# Patient Record
Sex: Female | Born: 1950 | Hispanic: Yes | Smoking: Current some day smoker
Health system: Southern US, Community
[De-identification: ages and names within clinical notes are randomized; demographics above are authoritative.]

## PROBLEM LIST (undated history)

## (undated) HISTORY — PX: ABDOMINAL SURGERY: SHX537

## (undated) HISTORY — PX: APPENDECTOMY: SHX54

## (undated) HISTORY — PX: SHOULDER SURGERY: SHX246

## (undated) HISTORY — PX: ABDOMINAL HYSTERECTOMY: SHX81

## (undated) HISTORY — PX: FOOT SURGERY: SHX648

---

## 2018-02-05 ENCOUNTER — Emergency Department (HOSPITAL_BASED_OUTPATIENT_CLINIC_OR_DEPARTMENT_OTHER): Payer: Medicare HMO

## 2018-02-05 ENCOUNTER — Emergency Department (HOSPITAL_BASED_OUTPATIENT_CLINIC_OR_DEPARTMENT_OTHER)
Admission: EM | Admit: 2018-02-05 | Discharge: 2018-02-05 | Disposition: A | Discharge status: Home / Self Care | Payer: Medicare HMO | Attending: Emergency Medicine | Admitting: Emergency Medicine

## 2018-02-05 ENCOUNTER — Other Ambulatory Visit: Payer: Self-pay

## 2018-02-05 ENCOUNTER — Encounter (HOSPITAL_BASED_OUTPATIENT_CLINIC_OR_DEPARTMENT_OTHER): Payer: Self-pay

## 2018-02-05 DIAGNOSIS — M67912 Unspecified disorder of synovium and tendon, left shoulder: Secondary | ICD-10-CM | POA: Diagnosis not present

## 2018-02-05 DIAGNOSIS — Y939 Activity, unspecified: Secondary | ICD-10-CM | POA: Insufficient documentation

## 2018-02-05 DIAGNOSIS — Y929 Unspecified place or not applicable: Secondary | ICD-10-CM | POA: Insufficient documentation

## 2018-02-05 DIAGNOSIS — F17200 Nicotine dependence, unspecified, uncomplicated: Secondary | ICD-10-CM | POA: Diagnosis not present

## 2018-02-05 DIAGNOSIS — Y999 Unspecified external cause status: Secondary | ICD-10-CM | POA: Diagnosis not present

## 2018-02-05 DIAGNOSIS — Z79899 Other long term (current) drug therapy: Secondary | ICD-10-CM | POA: Insufficient documentation

## 2018-02-05 DIAGNOSIS — W19XXXA Unspecified fall, initial encounter: Secondary | ICD-10-CM | POA: Diagnosis not present

## 2018-02-05 DIAGNOSIS — S4992XA Unspecified injury of left shoulder and upper arm, initial encounter: Secondary | ICD-10-CM | POA: Diagnosis present

## 2018-02-05 NOTE — ED Notes (Signed)
ED Provider at bedside. 

## 2018-02-05 NOTE — ED Triage Notes (Signed)
Pt states she fell 3 months ago-pain to left shoulder-NAD-steady gait

## 2018-02-05 NOTE — Discharge Instructions (Addendum)
You have what appears to be weakness to the rotator cuff.  This may be related to injury and possibly a tear.  I have attached information with sports medicine.  Please call and schedule appointment.  Follow-up with your primary care doctor.  You can apply cold compress to your shoulder and take ibuprofen 800 mg every 8 hours as needed.  You will likely need physical therapy.

## 2018-02-05 NOTE — ED Provider Notes (Signed)
[facility] EMERGENCY DEPARTMENT Provider Note   CSN: [id number] Arrival date & time: 02/05/18  1157     History   Chief Complaint Chief Complaint  Patient presents with  . Shoulder Injury    HPI Cyndie MullHelen Vigilante is a 67 y.o. female.  Patient is a 67 year old female presenting with subacute left shoulder pain after a mechanical fall 3 weeks ago.  PMH significant for prior bilateral rotator cuff repairs with her last being 10 years ago on [place].  Patient reports losing balance and falling approximately 3-4 weeks ago resulting in her falling on her anterior left shoulder.  She did not have any swelling immediately following the event but did have pain particularly on the inferior surface of her scapula.  Patient was hoping pain would resolve with use of Tylenol and ibuprofen while at home over the last few weeks but is here today because pain is not improving.  Patient does report some associated weakness with her left upper extremity since the fall otherwise sensation remains intact.  Patient reports pain is worse with reaching overhead.  She is an occasional smoker denies alcohol or illicit drug use.     History reviewed. No pertinent past medical history.  There are no active problems to display for this patient.   Past Surgical History:  Procedure Laterality Date  . ABDOMINAL HYSTERECTOMY    . ABDOMINAL SURGERY    . APPENDECTOMY    . FOOT SURGERY    . SHOULDER SURGERY       OB History   None      Home Medications    Prior to Admission medications   Medication Sig Start Date End Date Taking? Authorizing Provider  lisinopril (PRINIVIL,ZESTRIL) 10 MG tablet Take 10 mg by mouth daily.   Yes [provider]  QUEtiapine (SEROQUEL) 100 MG tablet Take 100 mg by mouth at bedtime.   Yes [provider]  rosuvastatin (CRESTOR) 10 MG tablet Take 10 mg by mouth daily.   Yes [provider]    Family History No  family history on file.  Social History Social History   Tobacco Use  . Smoking status: Current Some Day Smoker  . Smokeless tobacco: Never Used  Substance Use Topics  . Alcohol use: Yes    Comment: occ  . Drug use: Never     Allergies   Patient has no known allergies.   Review of Systems Review of Systems  Constitutional: Negative for chills and fever.  Eyes: Negative for pain and visual disturbance.  Respiratory: Negative for cough and shortness of breath.   Cardiovascular: Negative for chest pain and palpitations.  Gastrointestinal: Negative for abdominal pain and vomiting.  Genitourinary: Negative for dysuria and frequency.  Musculoskeletal: Positive for arthralgias, gait problem and joint swelling. Negative for back pain, myalgias, neck pain and neck stiffness.  Skin: Negative for color change and rash.  Neurological: Positive for weakness. Negative for dizziness, seizures, syncope, light-headedness, numbness and headaches.  All other systems reviewed and are negative.    Physical Exam Updated Vital Signs BP (!) 147/57 (BP Location: Right Arm)   Pulse 92   Temp 98.1 F (36.7 C) (Oral)   Resp 18   Ht 5\' 3"  (1.6 m)   Wt 70.8 kg   SpO2 97%   BMI 27.63 kg/m   Physical Exam  Constitutional: She appears well-developed and well-nourished. No distress.  HENT:  Head: Normocephalic and atraumatic.  Eyes: Pupils are equal, round,  and reactive to light. EOM are normal.  Neck: Normal range of motion. Neck supple.  Cardiovascular: Normal rate and regular rhythm.  No murmur heard. Pulmonary/Chest: Effort normal and breath sounds normal. No respiratory distress.  Abdominal: Soft. There is no tenderness.  Musculoskeletal:  Left and right shoulder symmetrical.  No step-off on left clavicle or signs of deformity.  No overlying ecchymoses and no induration at joint space.  Patient able to abduct to approximately 90 degrees passively and can further abduct to 140 degrees  passively.  Internal rotation preserved.  External rotation reduced on left shoulder limited due to tenderness.  AC joint without tenderness.  Inferior left scapula moderately tender on palpation.  Neurological: She is alert.  Skin: Skin is warm and dry.  Psychiatric: She has a normal mood and affect.  Nursing note and vitals reviewed.    ED Treatments / Results  Labs (all labs ordered are listed, but only abnormal results are displayed) Labs Reviewed - No data to display  EKG None  Radiology Dg Shoulder Left  Result Date: 02/05/2018 CLINICAL DATA:  Fall 3 months ago with top of shoulder pain. Initial encounter. EXAM: LEFT SHOULDER - 2+ VIEW COMPARISON:  None. FINDINGS: Prior distal clavicle resection. No fracture or dislocation. Negative visible left ribs. IMPRESSION: 1. No acute finding. 2. Distal clavicle resection. Electronically Signed   By: Marnee Spring M.D.   On: 02/05/2018 12:32    Procedures Procedures (including critical care time)  Medications Ordered in ED Medications - No data to display   Initial Impression / Assessment and Plan / ED Course  I have reviewed the triage vital signs and the nursing notes.  Pertinent labs & imaging results that were available during my care of the patient were reviewed by me and considered in my medical decision making (see chart for details).  Patient is a 67 year old female presenting with subacute left shoulder pain following a mechanical fall 3 weeks ago.  She does have minimal weakness with left arm and particularly reduced external rotation limited due to tenderness.  There is no obvious signs of deformity.  Patient appears comfortable on exam.  Left shoulder plain films revealing prior distal clavicle resection without signs of fracture or dislocation.  Clinical exam consistent with rotator cuff tendinopathy primarily involving external rotators suspicious for tear of infraspinatus.  Will provide patient with information with  sports medicine advised to follow-up with PCP for referral.  Reviewed return precautions.  Discussed conservative therapy.  Final Clinical Impressions(s) / ED Diagnoses   Final diagnoses:  Tendinopathy of left rotator cuff    ED Discharge Orders    None       Wendee Beavers, DO 02/05/18 1256    Maia Plan, MD 02/06/18 [phone]

## 2019-10-29 IMAGING — CR DG SHOULDER 2+V*L*
3 series · 3 of 3 positions shown · non-contrast
Comparison: None.

CLINICAL DATA: Fall 3 months ago with top of shoulder pain. Initial
encounter.

EXAM:
LEFT SHOULDER - 2+ VIEW

[w shoulder grashey left]
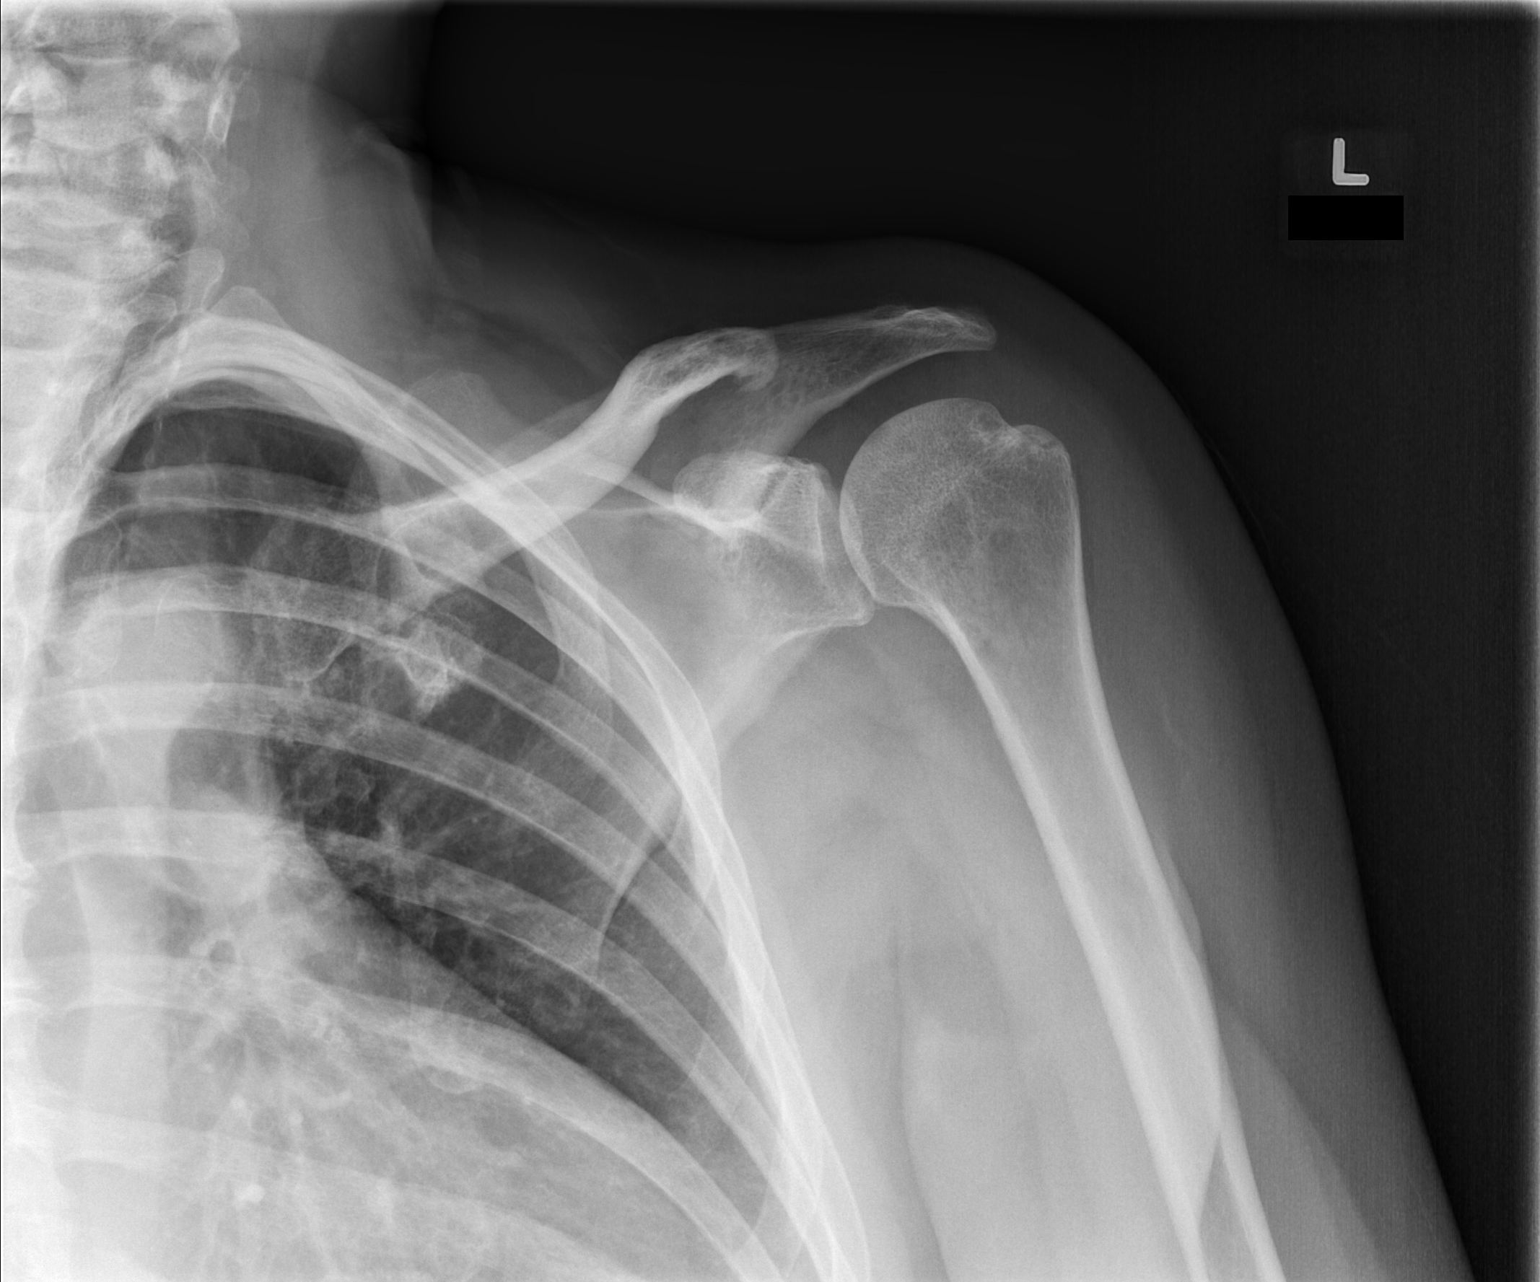

[w shoulder y view left]
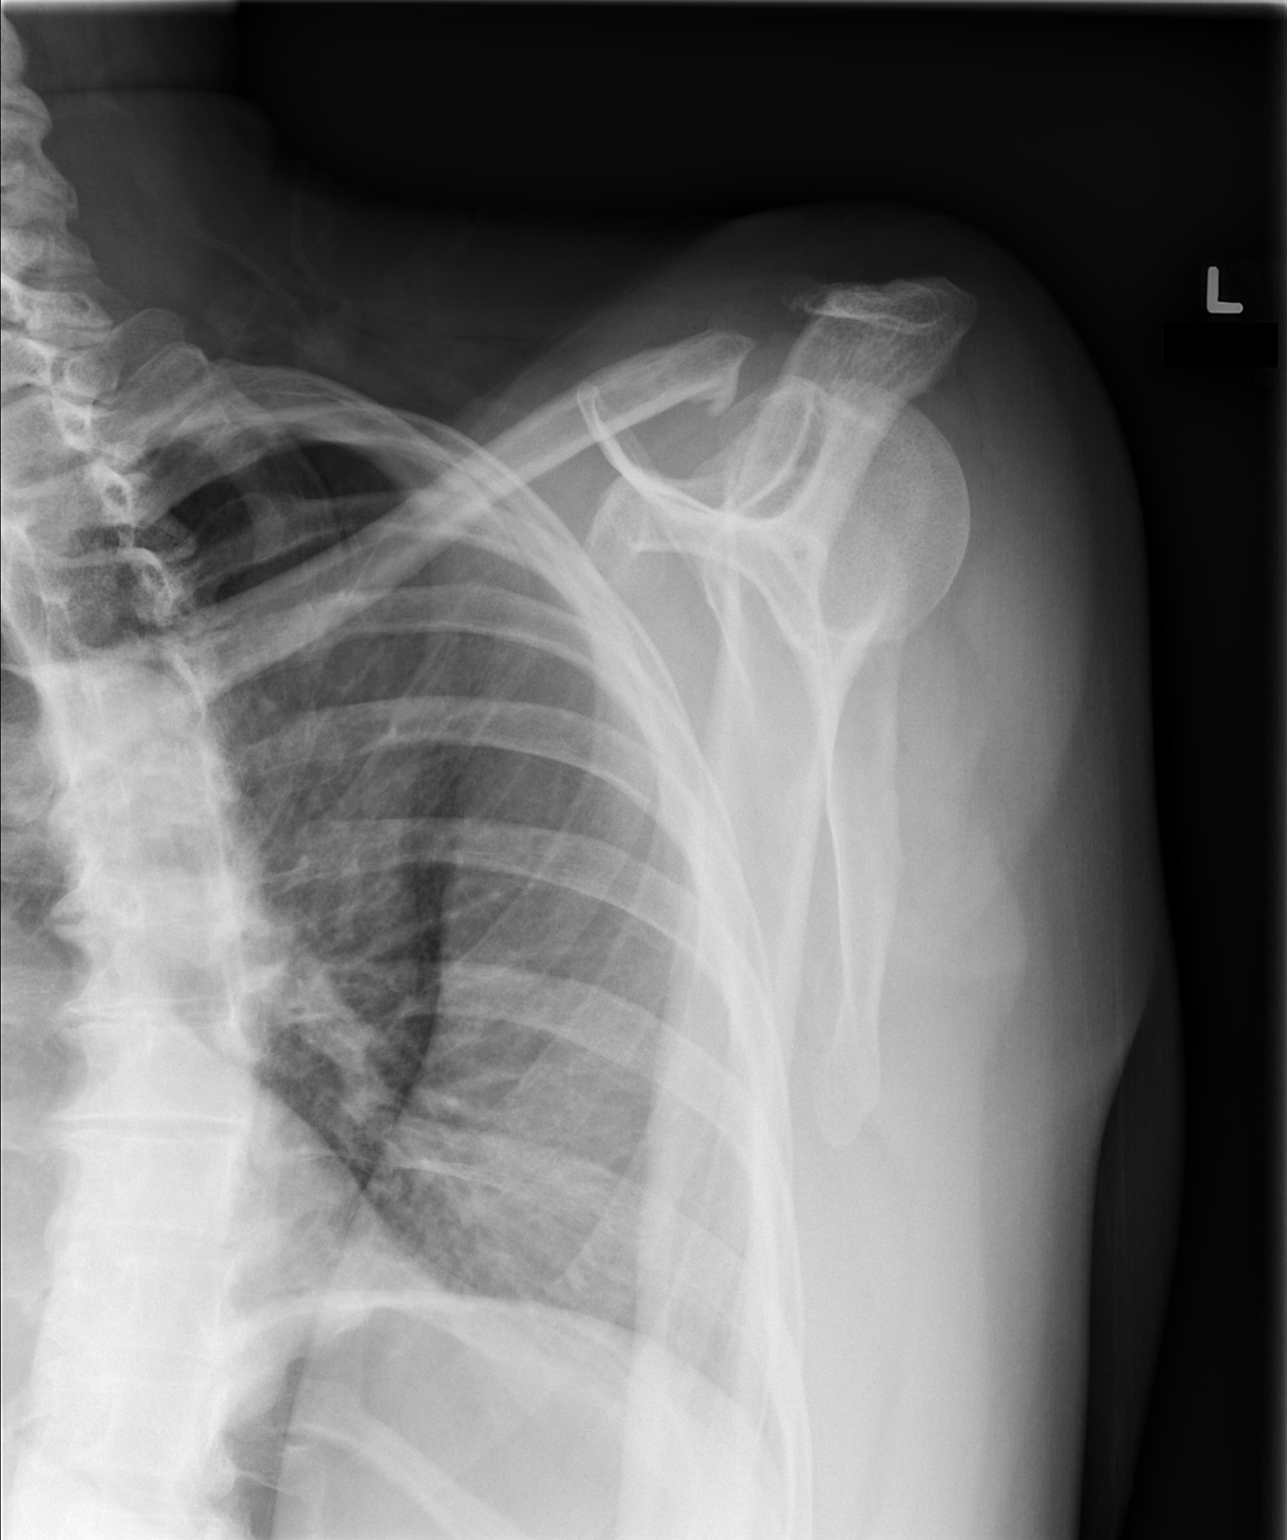

[x shoulder axillary left]
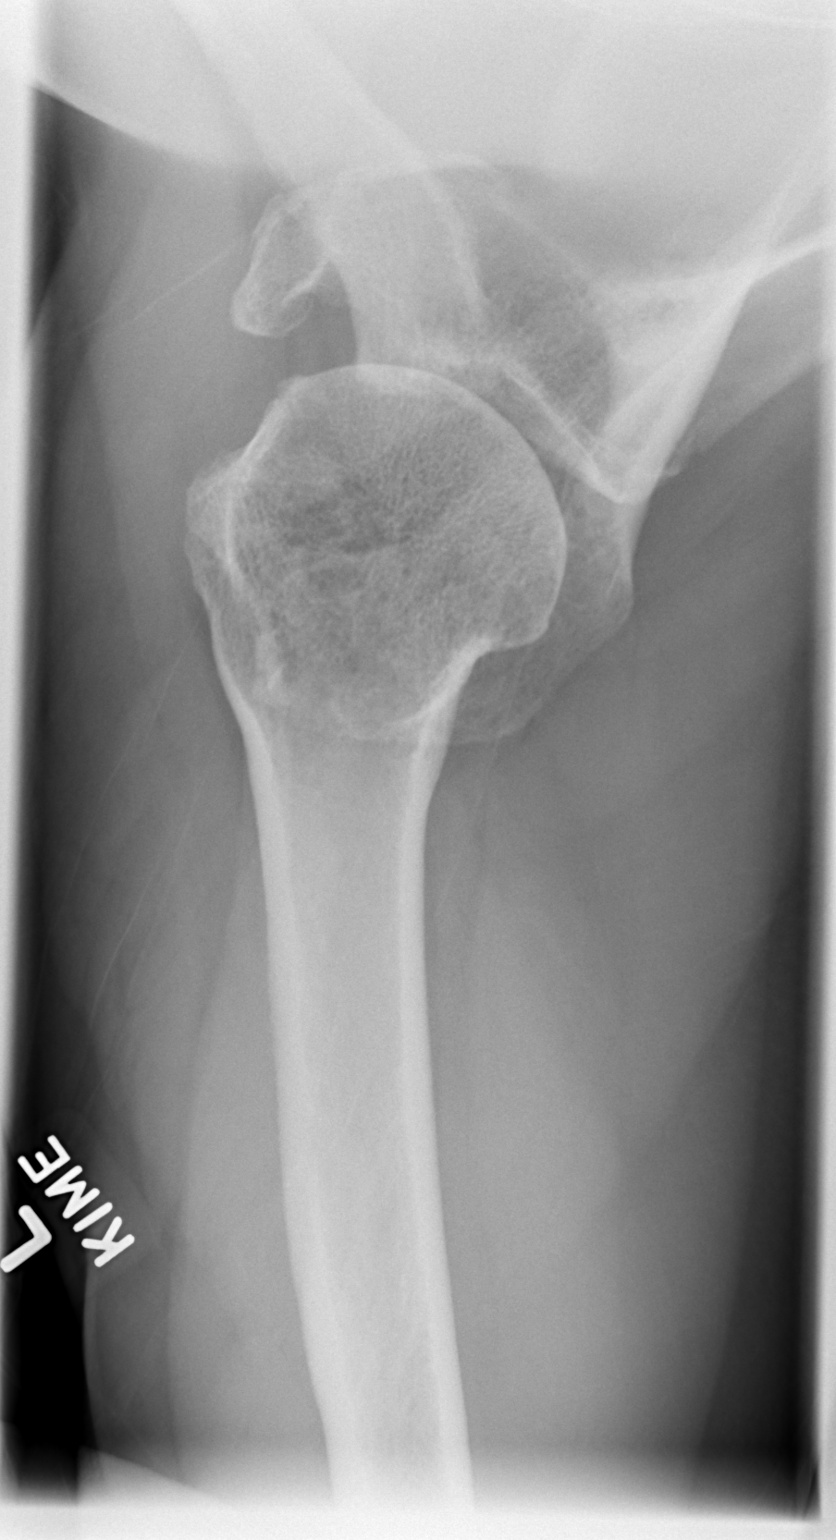

[3 of 3 positions shown; findings below may reference images not displayed]

FINDINGS: Prior distal clavicle resection. No fracture or dislocation.
Negative visible left ribs.
IMPRESSION: 1. No acute finding.
2. Distal clavicle resection.
